# Patient Record
Sex: Female | Born: 1975 | Hispanic: No | Marital: Married | Smoking: Never smoker
Health system: Southern US, Community
[De-identification: ages and names within clinical notes are randomized; demographics above are authoritative.]

## PROBLEM LIST (undated history)

## (undated) ENCOUNTER — Emergency Department (HOSPITAL_COMMUNITY): Admission: EM | Payer: BC Managed Care – PPO | Source: Home / Self Care

---

## 2015-09-20 DIAGNOSIS — M7062 Trochanteric bursitis, left hip: Secondary | ICD-10-CM | POA: Diagnosis not present

## 2015-09-20 DIAGNOSIS — M25552 Pain in left hip: Secondary | ICD-10-CM | POA: Diagnosis not present

## 2015-10-12 DIAGNOSIS — M7062 Trochanteric bursitis, left hip: Secondary | ICD-10-CM | POA: Diagnosis not present

## 2015-10-12 DIAGNOSIS — M76892 Other specified enthesopathies of left lower limb, excluding foot: Secondary | ICD-10-CM | POA: Diagnosis not present

## 2015-10-25 DIAGNOSIS — M7062 Trochanteric bursitis, left hip: Secondary | ICD-10-CM | POA: Diagnosis not present

## 2015-10-25 DIAGNOSIS — M76892 Other specified enthesopathies of left lower limb, excluding foot: Secondary | ICD-10-CM | POA: Diagnosis not present

## 2015-11-21 DIAGNOSIS — M25552 Pain in left hip: Secondary | ICD-10-CM | POA: Diagnosis not present

## 2015-11-21 DIAGNOSIS — S73192A Other sprain of left hip, initial encounter: Secondary | ICD-10-CM | POA: Diagnosis not present

## 2015-11-21 DIAGNOSIS — Y33XXXA Other specified events, undetermined intent, initial encounter: Secondary | ICD-10-CM | POA: Diagnosis not present

## 2015-11-21 DIAGNOSIS — M769 Unspecified enthesopathy, lower limb, excluding foot: Secondary | ICD-10-CM | POA: Diagnosis not present

## 2015-11-21 DIAGNOSIS — M7062 Trochanteric bursitis, left hip: Secondary | ICD-10-CM | POA: Diagnosis not present

## 2015-11-21 DIAGNOSIS — S73102A Unspecified sprain of left hip, initial encounter: Secondary | ICD-10-CM | POA: Diagnosis not present

## 2015-11-21 DIAGNOSIS — M7630 Iliotibial band syndrome, unspecified leg: Secondary | ICD-10-CM | POA: Diagnosis not present

## 2015-12-06 DIAGNOSIS — M7632 Iliotibial band syndrome, left leg: Secondary | ICD-10-CM | POA: Diagnosis not present

## 2015-12-06 DIAGNOSIS — M7062 Trochanteric bursitis, left hip: Secondary | ICD-10-CM | POA: Diagnosis not present

## 2015-12-31 DIAGNOSIS — G8929 Other chronic pain: Secondary | ICD-10-CM | POA: Diagnosis not present

## 2015-12-31 DIAGNOSIS — M25552 Pain in left hip: Secondary | ICD-10-CM | POA: Diagnosis not present

## 2016-01-07 DIAGNOSIS — M25552 Pain in left hip: Secondary | ICD-10-CM | POA: Diagnosis not present

## 2016-01-07 DIAGNOSIS — G8929 Other chronic pain: Secondary | ICD-10-CM | POA: Diagnosis not present

## 2016-01-15 DIAGNOSIS — G8929 Other chronic pain: Secondary | ICD-10-CM | POA: Diagnosis not present

## 2016-01-15 DIAGNOSIS — M25552 Pain in left hip: Secondary | ICD-10-CM | POA: Diagnosis not present

## 2016-01-23 DIAGNOSIS — M25552 Pain in left hip: Secondary | ICD-10-CM | POA: Diagnosis not present

## 2016-01-23 DIAGNOSIS — G8929 Other chronic pain: Secondary | ICD-10-CM | POA: Diagnosis not present

## 2016-01-30 DIAGNOSIS — M25552 Pain in left hip: Secondary | ICD-10-CM | POA: Diagnosis not present

## 2016-01-30 DIAGNOSIS — G8929 Other chronic pain: Secondary | ICD-10-CM | POA: Diagnosis not present

## 2016-02-23 DIAGNOSIS — R6889 Other general symptoms and signs: Secondary | ICD-10-CM | POA: Diagnosis not present

## 2016-02-23 DIAGNOSIS — R69 Illness, unspecified: Secondary | ICD-10-CM | POA: Diagnosis not present

## 2016-05-06 DIAGNOSIS — S73192A Other sprain of left hip, initial encounter: Secondary | ICD-10-CM | POA: Diagnosis not present

## 2016-05-19 DIAGNOSIS — N921 Excessive and frequent menstruation with irregular cycle: Secondary | ICD-10-CM | POA: Diagnosis not present

## 2016-05-19 DIAGNOSIS — Z0000 Encounter for general adult medical examination without abnormal findings: Secondary | ICD-10-CM | POA: Diagnosis not present

## 2016-05-19 DIAGNOSIS — Z1231 Encounter for screening mammogram for malignant neoplasm of breast: Secondary | ICD-10-CM | POA: Diagnosis not present

## 2016-05-20 DIAGNOSIS — N921 Excessive and frequent menstruation with irregular cycle: Secondary | ICD-10-CM | POA: Diagnosis not present

## 2016-05-20 DIAGNOSIS — Z0000 Encounter for general adult medical examination without abnormal findings: Secondary | ICD-10-CM | POA: Diagnosis not present

## 2016-05-20 DIAGNOSIS — S73192A Other sprain of left hip, initial encounter: Secondary | ICD-10-CM | POA: Diagnosis not present

## 2016-05-20 DIAGNOSIS — M25552 Pain in left hip: Secondary | ICD-10-CM | POA: Diagnosis not present

## 2016-06-05 DIAGNOSIS — Z1231 Encounter for screening mammogram for malignant neoplasm of breast: Secondary | ICD-10-CM | POA: Diagnosis not present

## 2016-06-05 DIAGNOSIS — N920 Excessive and frequent menstruation with regular cycle: Secondary | ICD-10-CM | POA: Diagnosis not present

## 2016-06-12 ENCOUNTER — Ambulatory Visit (INDEPENDENT_AMBULATORY_CARE_PROVIDER_SITE_OTHER): Payer: BLUE CROSS/BLUE SHIELD | Admitting: Sports Medicine

## 2016-06-12 DIAGNOSIS — M25552 Pain in left hip: Secondary | ICD-10-CM | POA: Diagnosis not present

## 2016-06-12 NOTE — Assessment & Plan Note (Addendum)
This is likely biomechanical. Recommended line drills for her to do 10 times for 75 yards each time she runs Gave hip strengthening exercises to maintain hip strength bilaterally. Also gave green inserts with a heel wedge on the left foot to help with leg length  Follow-up in 6 weeks

## 2016-06-12 NOTE — Progress Notes (Signed)
  Marie Mooney - 41 y.o. female MRN [id number]  Date of birth: 09/03/1975  SUBJECTIVE:  Including CC & ROS.  CC: left hip pain  Presents with left hip pain that is been ongoing for one year. She has had multiple imaging studies and physical therapy and injections to try to help with her pain.  She has seen a lot of doctors and no one has been able to figure out her pain. She had an MRI which showed a superior labral tear. However, she had an intra-articular injection which did not help with her pain while running. The pain is difficult to localize and occurs at different spots at different times. Sometimes it is more anterior sometimes it is more lateral and sometimes it is more posterior. It only occurs while she is running. It is worse when she is running on the road especially on hills. It does her a little bit with a treadmill, not so much with the elliptical.  It began when she was running and training for a marathon last year.  She denies any back pain, numbness, tingling, radicular pain.   ROS: No unexpected weight loss, fever, chills, swelling, instability, muscle pain, numbness/tingling, redness, otherwise see HPI   PMHx - Updated and reviewed.  Contributory factors include: Negative PSHx - Updated and reviewed.  Contributory factors include:  Negative FHx - Updated and reviewed.  Contributory factors include:  Negative Social Hx - Updated and reviewed. Contributory factors include: Runner Medications - reviewed   DATA REVIEWED: AP pelvis which showed a paucity of arthritis MRI left hip which showed a superior labral tear  PHYSICAL EXAM:  VS: BP:100/70  HR: bpm  TEMP: ( )  RESP:   HT:5\' 5"  (165.1 cm)   WT:118 lb (53.5 kg)  BMI:19.7 PHYSICAL EXAM: Gen: NAD, alert, cooperative with exam, well-appearing HEENT: clear conjunctiva,  CV:  no edema, capillary refill brisk, normal rate Resp: non-labored Skin: no rashes, normal turgor  Neuro: no gross deficits.  Psych:  alert and  oriented  Hip: ROM IR: 45 Deg, ER: 45 Deg, Flexion: 80 Deg, Extension: 100 Deg, Abduction: 45 Deg, Adduction: 45 Deg Strength IR: 5/5, ER: 5/5, Flexion: 5/5, Extension: 5/5, Abduction: 5/5, Adduction: 5/5 Pelvic alignment left hip shorter than right hip upon standing Left ASIS to left superior medial malleolus 78 centimeters Right ASIS to left superior medial malleolus 80 centimeters Standing hip rotation and gait without trendelenburg sign / unsteadiness. Greater trochanter without tenderness to palpation. No tenderness over piriformis. No pain with FABER or FADIR. No SI joint tenderness and normal minimal SI movement.  Running gait shows that she externally rotates her left foot, left knee goes into general valgus and left foot does overpronation  She also pronates left more than right when walking   ASSESSMENT & PLAN:   Left hip pain This is likely biomechanical. Recommended line drills for her to do 10 times for 75 yards each time she runs Gave hip strengthening exercises to maintain hip strength bilaterally. Also gave green inserts with a heel wedge on the left foot to help with leg length  Follow-up in 6 weeks  Patient was counseled reviewing diagnosis and treatment in detail, totaling in 45 minutes, over half of which was spent in face to face counseling and evaluation of running form.

## 2016-06-18 DIAGNOSIS — R946 Abnormal results of thyroid function studies: Secondary | ICD-10-CM | POA: Diagnosis not present

## 2016-06-18 DIAGNOSIS — Z124 Encounter for screening for malignant neoplasm of cervix: Secondary | ICD-10-CM | POA: Diagnosis not present

## 2016-06-18 DIAGNOSIS — N921 Excessive and frequent menstruation with irregular cycle: Secondary | ICD-10-CM | POA: Diagnosis not present

## 2016-06-20 DIAGNOSIS — R946 Abnormal results of thyroid function studies: Secondary | ICD-10-CM | POA: Diagnosis not present

## 2016-06-23 DIAGNOSIS — N921 Excessive and frequent menstruation with irregular cycle: Secondary | ICD-10-CM | POA: Diagnosis not present

## 2016-06-23 DIAGNOSIS — Z30430 Encounter for insertion of intrauterine contraceptive device: Secondary | ICD-10-CM | POA: Diagnosis not present

## 2016-07-17 ENCOUNTER — Ambulatory Visit (INDEPENDENT_AMBULATORY_CARE_PROVIDER_SITE_OTHER): Payer: BLUE CROSS/BLUE SHIELD | Admitting: Sports Medicine

## 2016-07-17 ENCOUNTER — Encounter: Payer: Self-pay | Admitting: Sports Medicine

## 2016-07-17 DIAGNOSIS — M25552 Pain in left hip: Secondary | ICD-10-CM | POA: Diagnosis not present

## 2016-07-17 DIAGNOSIS — M2170 Unequal limb length (acquired), unspecified site: Secondary | ICD-10-CM | POA: Diagnosis not present

## 2016-07-17 NOTE — Assessment & Plan Note (Signed)
Excellent response to gait retraining Still doing some HEP  I think this was likely triggered by her biomechanics and if she continues to improve and run without pain can see us prn

## 2016-07-17 NOTE — Assessment & Plan Note (Signed)
Cont. Using lift for all running  Given catalogue for insoles and for lift

## 2016-07-17 NOTE — Progress Notes (Signed)
   Subjective:    Patient ID: Marie Mooney, female    DOB: 01/26/1975, 41 y.o.   MRN: [id number]  HPI Ms. Marie Mooney is a 41 yo female who presents for follow up of Left hip pain. She was last seen in May and her symptoms were thought to be biomechanical. She was given line drills, hip strengthening exercises, and a heel wedge on the Left foot as she had some leg discrepancy. She reports her pain has significantly improved in the past 1.5 weeks. She is able to run longer distances and for a longer period of time. Now she stops less frequently but only to stretch as sometimes her left lateral thigh gets tight. Running uphill still bothers her. She does wear the heel wedge given to her.    Review of Systems No lower extremity weakness, numbness, or swelling     Objective:   Physical Exam Gen: NAD BP 100/60   Ht 5\' 5"  (1.651 m)   Wt 118 lb (53.5 kg)   BMI 19.64 kg/m   HIP: No tenderness to palpation of the greater trochanter Normal range of motion of the hip  Normal hip alignment and leg alignment  Normal strength  No pain with Fadir test  Neurovascularly intact  Good running form (much improved)  On gait she has resolved the dynamic genu valgus/ she is now bringing feet closer to midline and getting more normal terminal IR of hip bilat     Assessment & Plan:  Left Hip Pain: likely due to biomechanics. Symptoms improved  - continue strengthening exercise and wedge lift on the left foot.  - follow up PRN   Palma HolterKanishka G Phinneas Shakoor, MD PGY 2 Family Medicine   I observed and examined the patient with the resident and agree with assessment and plan.  Note reviewed and modified by me. Enid BaasKarl Fields, MD

## 2016-08-23 DIAGNOSIS — Z681 Body mass index (BMI) 19 or less, adult: Secondary | ICD-10-CM | POA: Diagnosis not present

## 2016-08-23 DIAGNOSIS — R300 Dysuria: Secondary | ICD-10-CM | POA: Diagnosis not present

## 2016-08-23 DIAGNOSIS — N3001 Acute cystitis with hematuria: Secondary | ICD-10-CM | POA: Diagnosis not present

## 2016-08-24 DIAGNOSIS — R300 Dysuria: Secondary | ICD-10-CM | POA: Diagnosis not present

## 2017-06-19 DIAGNOSIS — D225 Melanocytic nevi of trunk: Secondary | ICD-10-CM | POA: Diagnosis not present

## 2017-06-19 DIAGNOSIS — L720 Epidermal cyst: Secondary | ICD-10-CM | POA: Diagnosis not present

## 2017-06-19 DIAGNOSIS — L821 Other seborrheic keratosis: Secondary | ICD-10-CM | POA: Diagnosis not present

## 2017-06-19 DIAGNOSIS — L74512 Primary focal hyperhidrosis, palms: Secondary | ICD-10-CM | POA: Diagnosis not present

## 2017-10-21 DIAGNOSIS — R300 Dysuria: Secondary | ICD-10-CM | POA: Diagnosis not present

## 2017-12-09 DIAGNOSIS — R790 Abnormal level of blood mineral: Secondary | ICD-10-CM | POA: Diagnosis not present

## 2017-12-09 DIAGNOSIS — Z681 Body mass index (BMI) 19 or less, adult: Secondary | ICD-10-CM | POA: Diagnosis not present

## 2017-12-09 DIAGNOSIS — R7989 Other specified abnormal findings of blood chemistry: Secondary | ICD-10-CM | POA: Diagnosis not present

## 2017-12-09 DIAGNOSIS — Z0000 Encounter for general adult medical examination without abnormal findings: Secondary | ICD-10-CM | POA: Diagnosis not present

## 2018-01-11 ENCOUNTER — Ambulatory Visit (INDEPENDENT_AMBULATORY_CARE_PROVIDER_SITE_OTHER): Payer: BLUE CROSS/BLUE SHIELD | Admitting: Sports Medicine

## 2018-01-11 ENCOUNTER — Ambulatory Visit: Payer: Self-pay

## 2018-01-11 ENCOUNTER — Encounter: Payer: Self-pay | Admitting: Sports Medicine

## 2018-01-11 VITALS — BP 120/80 | Ht 65.0 in | Wt 117.0 lb

## 2018-01-11 DIAGNOSIS — M25552 Pain in left hip: Secondary | ICD-10-CM | POA: Diagnosis not present

## 2018-01-11 DIAGNOSIS — S76112A Strain of left quadriceps muscle, fascia and tendon, initial encounter: Secondary | ICD-10-CM | POA: Insufficient documentation

## 2018-01-11 MED ORDER — NITROGLYCERIN 0.2 MG/HR TD PT24: MEDICATED_PATCH | TRANSDERMAL | 1 refills | Status: AC

## 2018-01-11 NOTE — Progress Notes (Signed)
Silvio ClaymanLauren Ackley - 42 y.o. female MRN [id number]  Date of birth: 12/13/1975   Chief complaint: L thigh discomfort, fatigue  SUBJECTIVE:    History of present illness: Leotis ShamesLauren is a 42yo female runner who presents today with the chief complaint of L anterior thigh fatigue and discomfort. Symptoms began after she [place] a [place] in [place] in [place] 2019.  She describes her discomfort as feeling like her left thigh is tight and that she needs to stretch her quadriceps muscle.  She is attempted to run after her marathon however she continues to have this discomfort which is limiting her from running longer runs.  She has done the elliptical and did not have any significant discomfort with this.  She also has been taking part in TRX workouts and has been doing deep squats which she feels may have aggravated her symptoms.  Prior to her marathon, she did notice a vague discomfort after 1 mile of running however she was able to stretch it out and continue running.  She denies any severe groin pain.  She denies any numbness or tingling of the extremity.  No low back pain.  No knee pain associated with her current problems.  Last year, she was dealing with hip abductor weakness as well as a dynamic genu valgus running form which she corrected over the past year.  She does run in [employer] ghost shoes.  No other concerns today.  She denies any trauma or mechanical falls.   Review of systems:  As stated above   Interval past medical history, surgical history, family history, and social history obtained and are unchanged.   Of note, no history of stress fractures or osteoporosis.  No family history of bone or joint diseases.  She is a non-smoker.  Medications reviewed and unchanged. Allergies reviewed and unchanged.  OBJECTIVE:  Physical exam: Vital signs are reviewed. BP 120/80   Ht 5\' 5"  (1.651 m)   Wt 117 lb (53.1 kg)   BMI 19.47 kg/m   Gen.: Alert, oriented, appears stated age, in no apparent  distress Integumentary: No rashes or ecchymoses Neurologic:  Sensation is intact to light touch L4-S1 on the left Gait: normal without associated limp, 10 point gait analysis was performed today which demonstrates a neutral running posture.  She is a Engineer, agriculturalmidfoot Striker.  She has trivial external rotation of her hip on the left side with her great toe slightly lateral with foot strike.  Otherwise, significantly improved running form. Psych: Normal affect, mood is described as good Back: There is no midline cervical, thoracic, lumbar spine tenderness.  No reactive paraspinal tenderness on the left in the lumbar region. Musculoskeletal: Inspection of her left lower extremity demonstrates a trace leg length abnormality with the left leg being just slightly shorter than the right.  She has no tenderness palpation over her greater trochanter.  She has full range of motion of her left hip which is pain-free.  Strength testing of her hamstring 5 out of 5, rectus femoris 5 out of 5, sartorius 5 out of 5, hip abductors 5 out of 5.  Negative Pearlean BrownieFaber and Fadir testing.  Negative logroll test.  Negative lever test.  She is able to hop on her left leg without pain.  She is neurovascularly intact.  ULTRASOUND: L anterior hip Quick, limited diagnostic ultrasound obtained of patient's L anterior hip.  -Examination of the sartorius and long axis and transverse axis demonstrate no evidence of calcific tendinopathy, tears or a avulsion injuries. -Examination of  the rectus femoris at the AIIS demonstrates a calcification at the origin of the rectus femoris.  There is also a hypoechoic portion in the proximal rectus femoris without evidence of neovascularization consistent with prior injury/scar.  This was demonstrated both in long axis and short axis. -No evidence of femoral neck fractures. -Hyperechoic calcification over the labrum just distal to the acetabulum.  Partial hypoechoic irregularity of the labrum concerning for  possible tear.  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  Ultrasound and interpretation by Gustavus MessingAJ Pinney, DO and Sibyl ParrKarl B. 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  There is no evidence of neovascularization to this area.  Images were uploaded to the PACS system. -Nitroglycerin protocol ordered for this.  I did draw an marker where she should place the medicine.  I counseled on headaches and the use of acetaminophen for the first couple days when starting nitroglycerin.  She was counseled on common and severe side effects. -Hip quadriceps strengthening exercises given, four exercises to be completed daily for the next 4 to 6 weeks.  She was given hip flexion, hip flexion with external rotation, lateral step ups. -Crosstraining with recumbent bike and/or elliptical.  Avoid deep squatting as this may worsen symptoms.   Orders Placed This Encounter  Procedures  . US LIMITED JOINT SPACE STRUCTURES LOW LEFT    Standing Status:   Future    Number of Occurrences:   1    Standing Expiration Date:   03/12/2019    Order Specific Question:   Reason for Exam (SYMPTOM  OR DIAGNOSIS REQUIRED)    Answer:   left hip pain    Order Specific Question:   Preferred imaging location?    Answer:   Internal    Meds ordered this encounter  Medications  . nitroGLYCERIN (NITRODUR - DOSED IN MG/24 HR) 0.2 mg/hr patch    Sig: Use 1/4 patch daily to the affected area    Dispense:  30 patch    Refill:  1      Gustavus MessingAJ Pinney, DO Sports Medicine Fellow [place]  I observed and examined the patient with the [facility] and agree with assessment and plan.  Note reviewed and modified by me. Enid BaasKarl Hayes Czaja, MD

## 2018-01-11 NOTE — Patient Instructions (Signed)
Today, we performed an ultrasound of your hip. We saw a calcification in your rectus femoris tendon. We also found a small tear in your labrum of your left hip. We will rehab your hip girdle muscles to get you back running.   Nitroglycerin Protocol   Apply 1/4 nitroglycerin patch to affected area daily.  Change position of patch within the affected area every 24 hours.  You may experience a headache during the first 1-2 weeks of using the patch, these should subside.  If you experience headaches after beginning nitroglycerin patch treatment, you may take your preferred over the counter pain reliever.  Another side effect of the nitroglycerin patch is skin irritation or rash related to patch adhesive.  Please notify our office if you develop more severe headaches or rash, and stop the patch.  Tendon healing with nitroglycerin patch may require 12 to 24 weeks depending on the extent of injury.  Men should not use if taking Viagra, Cialis, or Levitra.   Do not use if you have migraines or rosacea.   Exercises:  We gave you hip flexor strengthening exercises to be completed daily for the next 4 weeks. You may perform these and then cross train with biking exercises. Elliptical is fine if you are pain free. Avoid deep squats and adduction exercises.  When you start to feel better, you may try running at 15-20 min at a time. You may increase by 10% each week if pain free. We will see you back in 4-6 weeks.  Merry Christmas!  Dr. Laureen OchsPinney, Dr. Darrick PennaFields

## 2018-01-11 NOTE — Assessment & Plan Note (Signed)
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  There is no evidence of neovascularization to this area.  Images were uploaded to the PACS system. -Nitroglycerin protocol ordered for this.  I did draw an marker where she should place the medicine.  I counseled on headaches and the use of acetaminophen for the first couple days when starting nitroglycerin.  She was counseled on common and severe side effects. -Hip quadriceps strengthening exercises given, four exercises to be completed daily for the next 4 to 6 weeks.  She was given hip flexion, hip flexion with external rotation, lateral step ups. -Crosstraining with recumbent bike and/or elliptical.  Avoid deep squatting as this may worsen symptoms.

## 2018-02-16 ENCOUNTER — Ambulatory Visit (INDEPENDENT_AMBULATORY_CARE_PROVIDER_SITE_OTHER): Payer: BLUE CROSS/BLUE SHIELD | Admitting: Sports Medicine

## 2018-02-16 VITALS — BP 94/62 | Ht 65.0 in | Wt 118.0 lb

## 2018-02-16 DIAGNOSIS — S76112D Strain of left quadriceps muscle, fascia and tendon, subsequent encounter: Secondary | ICD-10-CM

## 2018-02-16 DIAGNOSIS — M25552 Pain in left hip: Secondary | ICD-10-CM | POA: Diagnosis not present

## 2018-02-16 NOTE — Progress Notes (Signed)
    Subjective:  Marie Mooney is a 43 y.o. female who presents to the [facility] today for follow-up for left hip pain.   HPI: Patient was seen in clinic approximately 1 month ago for left hip pain.  Point-of-care ultrasound showed calcific tendinosis of the rectus femoris at the origin of the AIIS and a partial tendon irregularity concerning for possible labrum tear.  Patient was given series of strengthening exercises and nitroglycerin patches.  She reports today that she has not had any improvement of her pain.  Is not worsening though.  She is tried to do the strengthening exercises daily, and has had no improvement of pain with the nitroglycerin patches.  Patient is also tried to be somewhat active at home doing elliptical or cycling with her family.  She has tried some light running but this is also been difficult.  The pain she says is located in her left inguinal region and can sometimes radiate down the left side of her leg.  She describes the radiation not so much as a pain but more of a "fatigue".  This is particularly worse when walking uphill or doing an incline, although that she can experience when just walking to work.  Patient not interested in taking any pain medication to mask pain, she is open to physical therapy has possible remedy.    Objective:  Physical Exam: BP 94/62   Ht 5\' 5"  (1.651 m)   Wt 118 lb (53.5 kg)   BMI 19.64 kg/m   Gen: NAD, seated comfortably  Left hip Inspection: No obvious gross swelling or irregularity Palpation: Mildly tender to palpation in the inguinal region ROM: 80 degrees hip flexion, full range of motion internal/external rotation Strength: 5/5 strength in rectus femoris, hip flexors and quads, 5/5 hamstrings, 5/5 hip adductors/abductors, 5/5 ankle plantar flexors. Stability: Joint is stable Special tests: Negative straight leg, [place] Vascular studies: Neurovascularly intact  Running gait is without a limp and good form   No results found  for this or any previous visit (from the past 72 hour(s)).   Assessment/Plan:  Strain of left quadriceps muscle Patient following up from strain of left quadriceps muscle.  No improvement with home exercise and Nitropatch.  Patient has good strength throughout.  Likely needs longer course of home physical therapy.  The addition of adjustable ankle weights to further increase strength. Follow-up in 4-8 weeks.   Lab Orders  No laboratory test(s) ordered today    No orders of the defined types were placed in this encounter.     Thomes Dinning, MD, MS FAMILY MEDICINE RESIDENT - PGY2 02/16/2018 11:27 AM   I observed and examined the patient with the resident and agree with assessment and plan.  Note reviewed and modified by me. Sterling Big, MD

## 2018-02-16 NOTE — Assessment & Plan Note (Addendum)
Patient following up from strain of left quadriceps muscle.  Minimal improvement with home exercise and Nitropatch.  Patient has good strength throughout.  Likely needs longer course of home physical therapy.  The addition of adjustable ankle weights to further increase strength. Follow-up in 4-8 weeks.  Increase NTG to 1/2 patch  OK to continue easy running on level

## 2018-02-16 NOTE — Assessment & Plan Note (Signed)
Joint exam is unremarkable and full ROM  Still appears to be hip flexor strain

## 2018-04-20 ENCOUNTER — Ambulatory Visit: Payer: BLUE CROSS/BLUE SHIELD | Admitting: Family Medicine

## 2018-04-20 ENCOUNTER — Encounter: Payer: Self-pay | Admitting: Sports Medicine

## 2018-04-20 ENCOUNTER — Other Ambulatory Visit: Payer: Self-pay

## 2018-04-20 ENCOUNTER — Ambulatory Visit (INDEPENDENT_AMBULATORY_CARE_PROVIDER_SITE_OTHER): Payer: BLUE CROSS/BLUE SHIELD | Admitting: Sports Medicine

## 2018-04-20 DIAGNOSIS — S76112D Strain of left quadriceps muscle, fascia and tendon, subsequent encounter: Secondary | ICD-10-CM | POA: Diagnosis not present

## 2018-04-20 NOTE — Progress Notes (Signed)
Patient consented to a video visit  Follow-up of left quadriceps calcific tendinopathy and hip pain  Patient first experienced hip pain in October 2019 running the [employer]	[place] marathon The pain started as tightness in her left upper thigh Following the marathon she could not get back to doing any long runs because of pain She was able to do an elliptical without pain  She was also involved in some strength workouts that included deep squats These deep squat would aggravate her pain as well  She had a prior history of hip abduction weakness  Ultrasound examination on 01/11/2018 demonstrated some calcification at the AIIS insertion of the rectus femoris as well as some calcification in her labrum  Based on her exam we felt the findings were most consistent with a rectus femoris tendinopathy and calcification  Since the initial visit she has started on a home exercise program to include some biking and hip exercises.  We also tried a nitroglycerin protocol.  No significant improvement noted on her follow-up visit January 28. At that point we increased her nitroglycerin to one half patch and also had her purchase ankle weights to increase the home exercise protocol  Since the time of her second visit she is able to walk up steps to do all her normal activities without pain She is unsure whether nitroglycerin was helping or not but she has cut back because she was getting a rash She has restarted running but can only go about 1/4 mile before she does some walking so she is alternating walking and running for up to 4 to 5 miles She is doing this on more of an every other day basis Since the gymnasium's are close down she has not been biking but has continued to use the elliptical at home without problems   Past history Evaluated in the past for a slight leg length inequality History of lateral hip pain which resolved with exercises   Review of systems No sciatica No groin pain No  pain and activities of daily living or at nighttime No numbness or tingling   Examination was deferred since this was a video visit However she was able to demonstrate that the area of discomfort still is in the upper thigh The tightness she gets is in a distribution of her quadriceps She can demonstrate good range of motion of the hip without pain

## 2018-04-20 NOTE — Assessment & Plan Note (Signed)
Unfortunately I think this has been a difficult injury to resolve I believe she must of had a pretty significant original tendon insertion injury for the rectus femoris  Currently her home exercises are emphasizing her abductors and rotators more than her hip flexors I wanted her to add back time on the bike at least every other day Add forward step ups and gradually build this to 2 steps using the ankle weights Keep up other hip exercises with the ankle weights  She is okay to continue easy running mixed with walking but should not push through significant pain or any limping  If this simply does not resolve we need to repeat the ultrasound and consider whether an injection into the calcified area might be helpful  Return or video visit in 4 weeks

## 2018-05-04 ENCOUNTER — Ambulatory Visit: Payer: BLUE CROSS/BLUE SHIELD | Admitting: Sports Medicine

## 2018-06-15 ENCOUNTER — Ambulatory Visit: Payer: Self-pay

## 2018-06-15 ENCOUNTER — Ambulatory Visit (INDEPENDENT_AMBULATORY_CARE_PROVIDER_SITE_OTHER): Payer: BLUE CROSS/BLUE SHIELD | Admitting: Sports Medicine

## 2018-06-15 ENCOUNTER — Encounter: Payer: Self-pay | Admitting: Sports Medicine

## 2018-06-15 ENCOUNTER — Other Ambulatory Visit: Payer: Self-pay

## 2018-06-15 VITALS — BP 90/64 | Ht 65.0 in | Wt 118.0 lb

## 2018-06-15 DIAGNOSIS — M25552 Pain in left hip: Secondary | ICD-10-CM

## 2018-06-15 NOTE — Assessment & Plan Note (Signed)
-  Initial thought was a strain of her left quadriceps muscle.  This appears to be resolved via ultrasound findings and physical exam. -Now currently of unclear etiology.  She does have a history of labral injury on that side in the past which may be also playing a role in delaying her recovery -Offered MRI of her left hip however we elected to not perform this given her known history of labral tear and history of 2 previous MRIs.  She also did have 2 failed hip injections with steroid in the past and we elected not to repeat this today. -Trial of 6 weeks of no impact activities.  No running. -Reviewed home exercise program and modified activities for home today.  She will take part in biking/elliptical -Nitroglycerin patch as prescribed as tolerated -Follow-up in 6 to 8 weeks.  If at that point she is not improving, we would strongly recommend obtaining an MRI versus doing a diagnostic hip injection -Body helix hip brace given today

## 2018-06-15 NOTE — Progress Notes (Addendum)
Marie Mooney - 43 y.o. 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  She was last seen back in March and has been managed conservatively for a left hip injury that occurred after the [employer].  She is frustrated as she has not felt any significant improvement for the past 5 months.  She continues to have left-sided hip and anterior thigh pain that is worse with activity.  She rates it 7 out of 10 with activity.  She is not having any difficulties with her activities of daily life.  No pain with walking.  She has tried running 3-4 times per week however she has to run walk one quarter of a mile at a time.  She does get to the point where she is limping.  She has tried crosstraining with biking which seems to be more tolerable.  She denies any new symptoms of numbness or tingling of her extremity.  Denies any low back pain.  No groin pain.  Treatment to date includes: Nitroglycerin patch, anti-inflammatory medications, home exercise program, and activity modification.  Of note which was not previously noted in prior encounters, she has a history of a labral tear of her left hip.   Review of systems:  Per HPI; in addition no fever, no rash, no additional weakness, no additional numbness, no additional paresthesias, and no additional fall/injury   Interval past medical history, surgical history, family history, and social history obtained.  Of note, she has a history of a labral tear of her left hip.  She is an avid marathon runner.  Medications reviewed and unchanged. Allergies reviewed and unchanged.  OBJECTIVE:  Physical exam: Vital signs are reviewed. BP 90/64   Ht 5\' 5"  (1.651 m)   Wt 118 lb (53.5 kg)   BMI 19.64 kg/m   Gen.: Alert, oriented, appears stated age, in no apparent distress Integumentary: No rashes  or ecchymoses of the left hip or upper leg Neurologic:  Sensation intact light touch L4-S1.  Negative straight leg raise on the left.  Negative Tinel's at the inguinal ligament Gait: normal without associated limp Musculoskeletal: Inspection of the left hip demonstrates no acute abnormalities.  No tenderness palpation over her greater trochanter.  No tenderness palpation at the ASIS or AIIS.  No limitations in internal rotation of the hip.  Full range of motion in all planes.  Strength testing of the hip is 5 out of 5 in resisted flexion, extension, abduction, and adduction.  Negative logroll test.  Negative Faber test.  Negative Fadir test.  Negative Stinchfield test.  Functional testing: Performed sit stand test in 60 seconds, 36 times 30 second wall sit performed without any reproduction of symptoms   ULTRASOUND: Left hip Limited diagnostic ultrasound obtained of patient's left hip.  -ASIS was visualized without any evidence of abnormalities or  irregularities in the sartorius. -AIIS was visualized with calcific change in the rectus femoris however no new tearing.  No evidence of neovascularization. -Hip joint was visualized with a hyperechoic calcification in the labrum distal to the acetabulum.  This is concerning for tear.  No evidence of cortical irregularities of the femoral neck.  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  Probable tear in the labrum.  Ultrasound and interpretation by Dr Laureen Ochs and Sibyl Parr. Darrick Penna, MD  ASSESSMENT & PLAN: Left hip pain -Initial thought was a strain of her left quadriceps muscle.  This appears to be resolved via ultrasound findings and physical exam. -Now currently of unclear etiology.  She does have a history of labral injury on that side in the past which may be also playing a role in delaying her recovery -Offered MRI of her left hip however we elected to not perform this given her known history of  labral tear and history of 2 previous MRIs.  She also did have 2 failed hip injections with steroid in the past and we elected not to repeat this today. -Trial of 6 weeks of no impact activities.  No running. -Reviewed home exercise program and modified activities for home today.  She will take part in biking/elliptical -Nitroglycerin patch as prescribed as tolerated -Follow-up in 6 to 8 weeks.  If at that point she is not improving, we would strongly recommend obtaining an MRI versus doing a diagnostic hip injection -Body helix hip brace given today   Orders Placed This Encounter  Procedures  . US LIMITED JOINT SPACE STRUCTURES LOW LEFT    Standing Status:   Future    Number of Occurrences:   1    Standing Expiration Date:   08/14/2019    Order Specific Question:   Reason for Exam (SYMPTOM  OR DIAGNOSIS REQUIRED)    Answer:   hip pain    Order Specific Question:   Preferred imaging location?    Answer:   Internal     Gustavus MessingAJ Pinney, DO Sports Medicine Fellow [facility]  I observed and examined the patient with Dr. Laureen OchsPinney and agree with assessment and plan.  Note reviewed and modified by me. Sterling BigKB Fields, MD

## 2018-07-29 NOTE — Addendum Note (Signed)
Addended by: Cyd Silence on: 07/29/2018 01:48 PM   Modules accepted: Orders

## 2018-08-15 ENCOUNTER — Ambulatory Visit
Admission: RE | Admit: 2018-08-15 | Discharge: 2018-08-15 | Disposition: A | Discharge status: Home / Self Care | Payer: BLUE CROSS/BLUE SHIELD | Source: Ambulatory Visit | Attending: Sports Medicine | Admitting: Sports Medicine

## 2018-08-15 ENCOUNTER — Other Ambulatory Visit: Payer: Self-pay

## 2018-08-15 DIAGNOSIS — S76112A Strain of left quadriceps muscle, fascia and tendon, initial encounter: Secondary | ICD-10-CM | POA: Diagnosis not present

## 2018-08-15 DIAGNOSIS — M25552 Pain in left hip: Secondary | ICD-10-CM

## 2018-08-25 ENCOUNTER — Other Ambulatory Visit: Payer: BLUE CROSS/BLUE SHIELD

## 2018-09-21 ENCOUNTER — Encounter: Payer: Self-pay | Admitting: *Deleted

## 2018-09-21 NOTE — Progress Notes (Signed)
Dr Deatra Robinson [facility] [address] [place], [place] [address] Phone: [phone] Tues 10/12/18 at 1p

## 2018-10-01 DIAGNOSIS — M25552 Pain in left hip: Secondary | ICD-10-CM | POA: Diagnosis not present

## 2018-10-14 DIAGNOSIS — M25552 Pain in left hip: Secondary | ICD-10-CM | POA: Diagnosis not present

## 2018-10-29 DIAGNOSIS — M25552 Pain in left hip: Secondary | ICD-10-CM | POA: Diagnosis not present

## 2018-11-03 DIAGNOSIS — M25552 Pain in left hip: Secondary | ICD-10-CM | POA: Diagnosis not present

## 2018-11-11 DIAGNOSIS — M25552 Pain in left hip: Secondary | ICD-10-CM | POA: Diagnosis not present

## 2018-11-25 DIAGNOSIS — M25552 Pain in left hip: Secondary | ICD-10-CM | POA: Diagnosis not present

## 2018-12-03 DIAGNOSIS — M25552 Pain in left hip: Secondary | ICD-10-CM | POA: Diagnosis not present

## 2018-12-15 DIAGNOSIS — Z0000 Encounter for general adult medical examination without abnormal findings: Secondary | ICD-10-CM | POA: Diagnosis not present

## 2018-12-15 DIAGNOSIS — R7989 Other specified abnormal findings of blood chemistry: Secondary | ICD-10-CM | POA: Diagnosis not present

## 2018-12-15 DIAGNOSIS — R55 Syncope and collapse: Secondary | ICD-10-CM | POA: Diagnosis not present

## 2018-12-15 DIAGNOSIS — Z23 Encounter for immunization: Secondary | ICD-10-CM | POA: Diagnosis not present

## 2018-12-15 DIAGNOSIS — I7300 Raynaud's syndrome without gangrene: Secondary | ICD-10-CM | POA: Diagnosis not present

## 2018-12-24 DIAGNOSIS — Z0000 Encounter for general adult medical examination without abnormal findings: Secondary | ICD-10-CM | POA: Diagnosis not present

## 2018-12-24 DIAGNOSIS — R7989 Other specified abnormal findings of blood chemistry: Secondary | ICD-10-CM | POA: Diagnosis not present

## 2018-12-30 DIAGNOSIS — M25552 Pain in left hip: Secondary | ICD-10-CM | POA: Diagnosis not present

## 2019-01-31 DIAGNOSIS — I7300 Raynaud's syndrome without gangrene: Secondary | ICD-10-CM | POA: Diagnosis not present

## 2019-01-31 DIAGNOSIS — Z7184 Encounter for health counseling related to travel: Secondary | ICD-10-CM | POA: Diagnosis not present

## 2019-03-23 DIAGNOSIS — Z23 Encounter for immunization: Secondary | ICD-10-CM | POA: Diagnosis not present

## 2019-04-20 DIAGNOSIS — Z23 Encounter for immunization: Secondary | ICD-10-CM | POA: Diagnosis not present

## 2019-05-26 DIAGNOSIS — Z23 Encounter for immunization: Secondary | ICD-10-CM | POA: Diagnosis not present

## 2019-05-26 DIAGNOSIS — D72819 Decreased white blood cell count, unspecified: Secondary | ICD-10-CM | POA: Diagnosis not present

## 2019-05-26 DIAGNOSIS — Z283 Underimmunization status: Secondary | ICD-10-CM | POA: Diagnosis not present

## 2019-07-14 DIAGNOSIS — Z23 Encounter for immunization: Secondary | ICD-10-CM | POA: Diagnosis not present

## 2019-08-18 DIAGNOSIS — Z23 Encounter for immunization: Secondary | ICD-10-CM | POA: Diagnosis not present

## 2021-02-25 IMAGING — MR MRI OF THE LEFT HIP WITHOUT CONTRAST
5 series · 34 of 40 positions shown · non-contrast
Comparison: None.

CLINICAL DATA: Chronic left hip and leg pain when running, hiking
and walking up stairs x 9. Pt states issues are likely from a no
known injury. Previous labral injury 3 and 4 years ago.

EXAM:
MR OF THE LEFT HIP WITHOUT CONTRAST
TECHNIQUE: Multiplanar, multisequence MR imaging was performed. No intravenous
contrast was administered.

[Series 9: T2 fat-sat · coronal · left · 3.0mm · 0.80mm/px · 8 of 30 slices shown (1 of 2)]
[im 1/30]
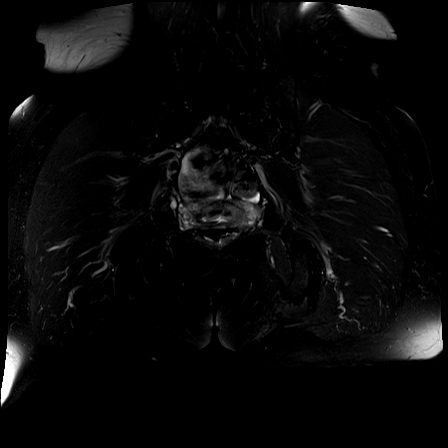
[im 5/30]
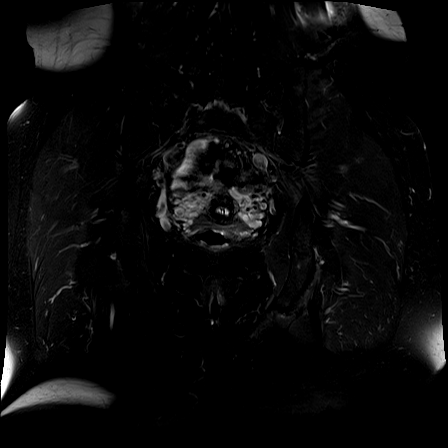
[im 9/30]
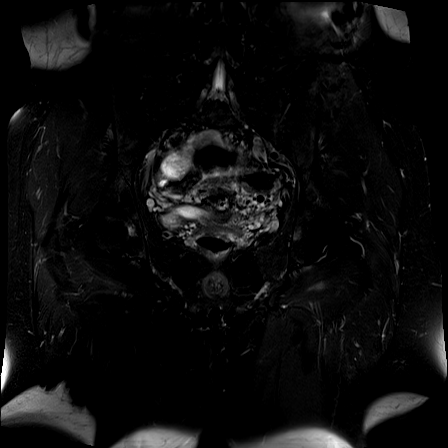
[im 13/30]
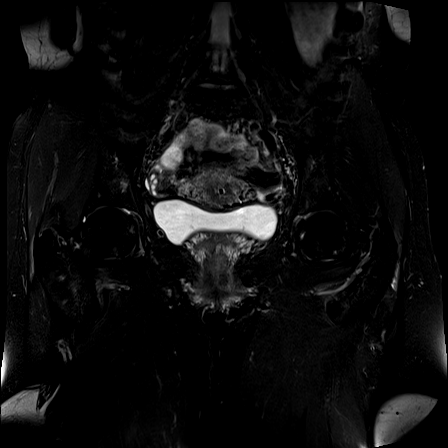
[im 17/30]
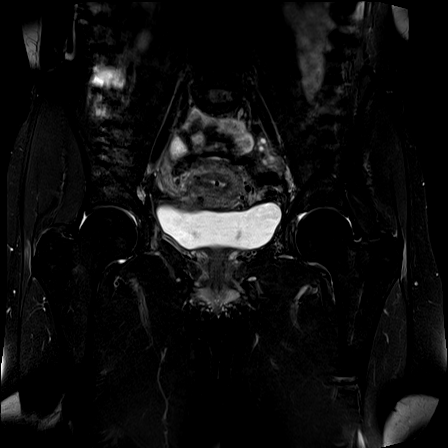
[im 21/30]
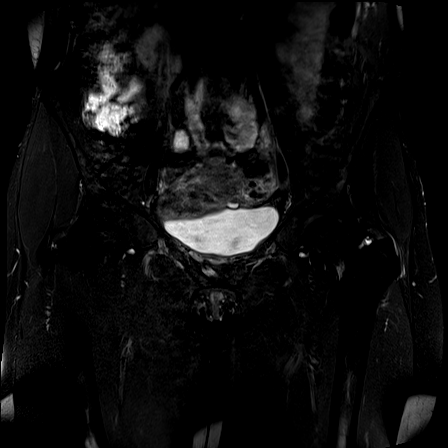
[im 25/30]
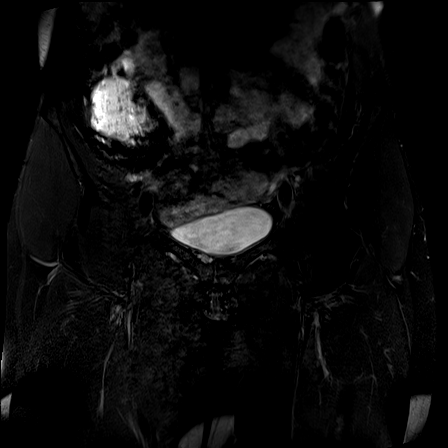
[im 30/30]
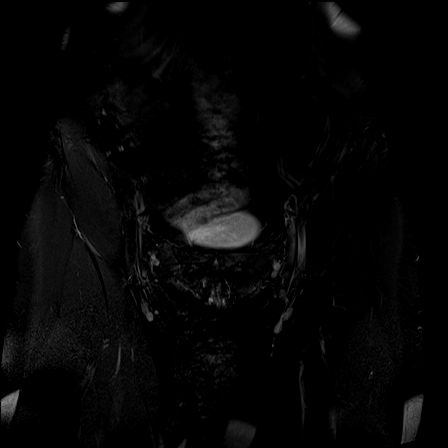

[Series 10: T1 · coronal · left · 3.0mm · 0.80mm/px · 2 of 30 slices shown]
[im 1/30]
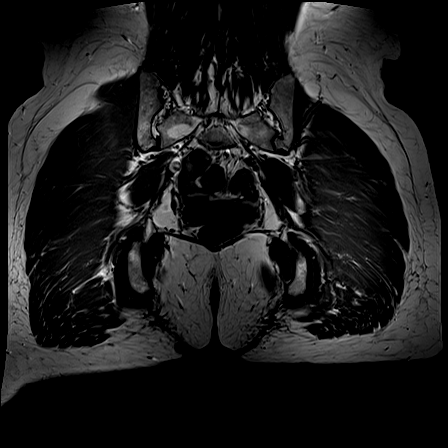
[im 5/30]
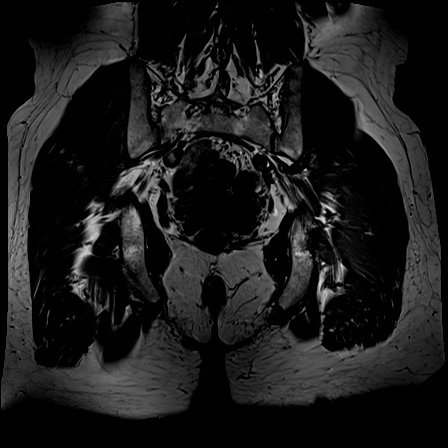

[Series 11: T2 fat-sat · axial · left · 3.0mm · 0.50mm/px · z∈[-171,-60]mm · 8 of 32 slices shown (2 of 2)]
[im 1/32]
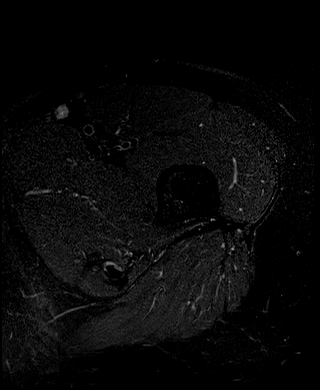
[im 5/32]
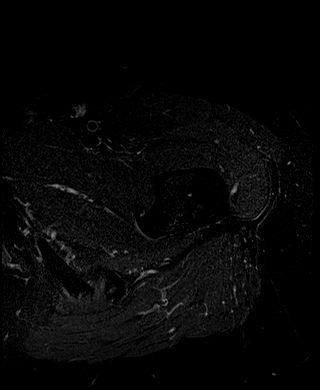
[im 9/32]
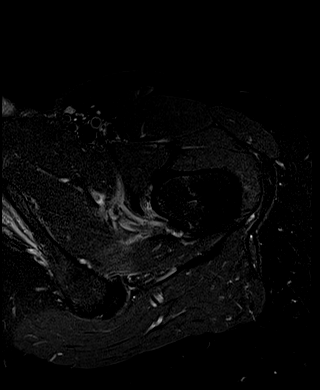
[im 14/32]
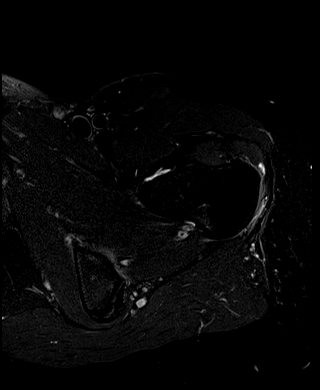
[im 18/32]
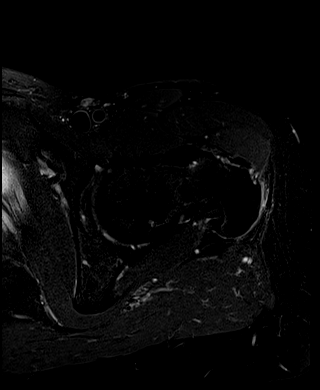
[im 23/32]
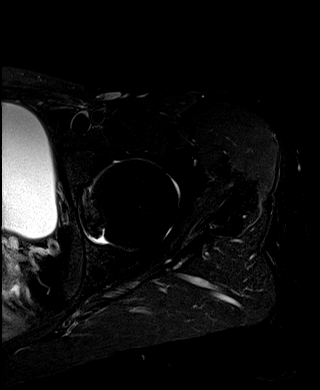
[im 27/32]
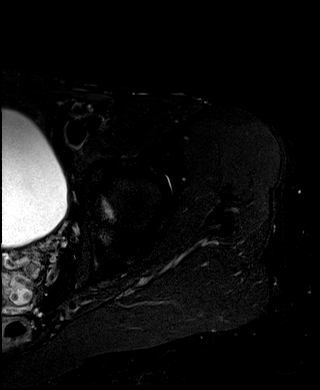
[im 32/32]
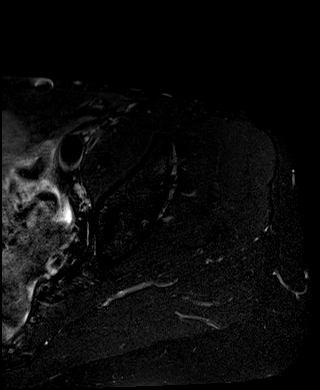

[Series 12: PD fat-sat · coronal · left · 3.0mm · 0.56mm/px · 7 of 28 slices shown (1 of 2)]
[im 1/28]
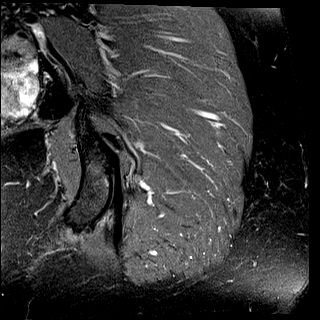
[im 5/28]
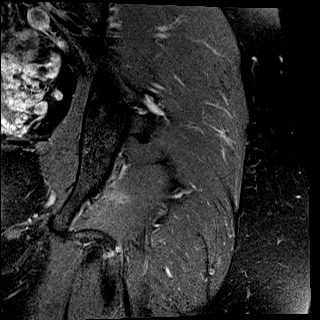
[im 10/28]
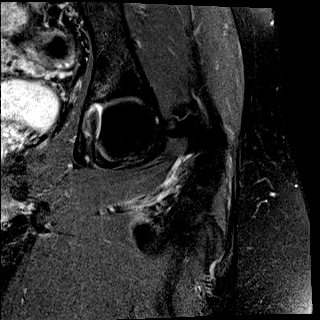
[im 14/28]
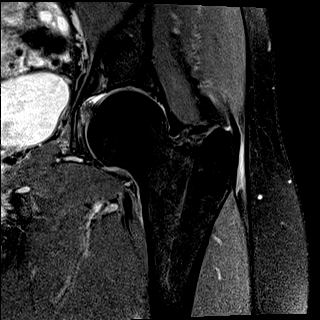
[im 19/28]
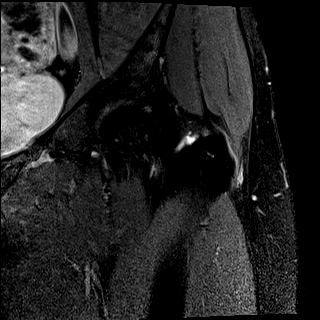
[im 23/28]
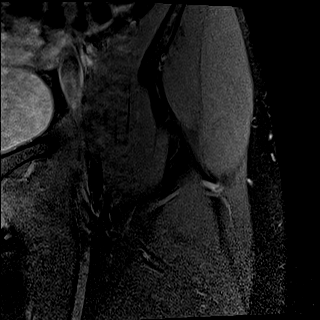
[im 28/28]
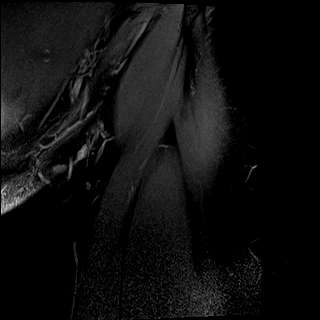

[Series 13: PD fat-sat · sagittal · left · 3.0mm · 0.56mm/px · 9 of 35 slices shown (2 of 2)]
[im 1/35]
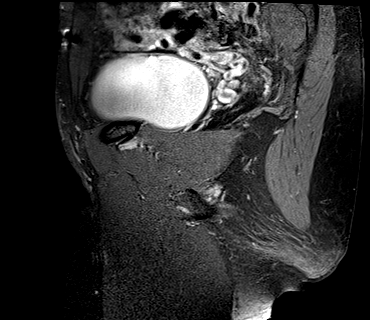
[im 5/35]
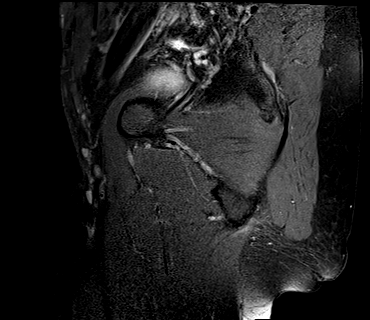
[im 9/35]
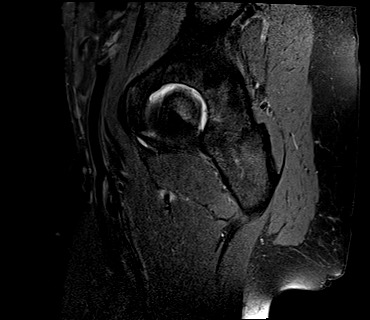
[im 13/35]
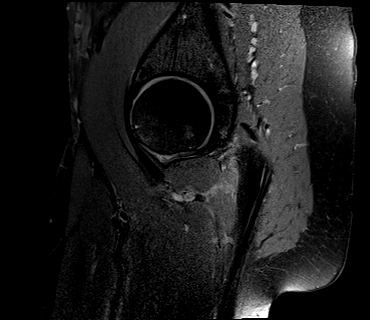
[im 18/35]
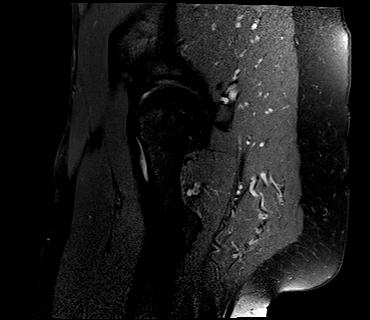
[im 22/35]
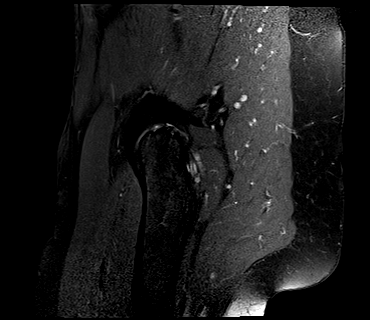
[im 26/35]
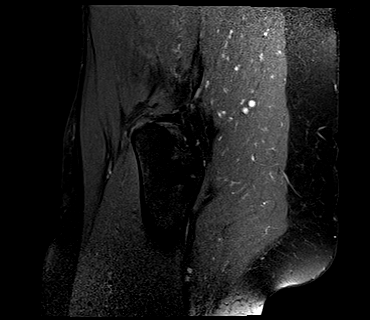
[im 30/35]
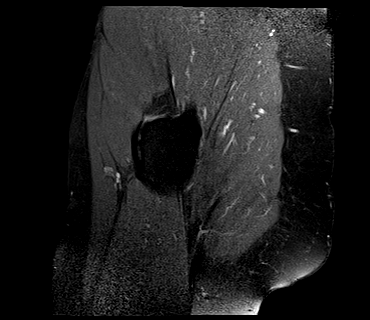
[im 35/35]
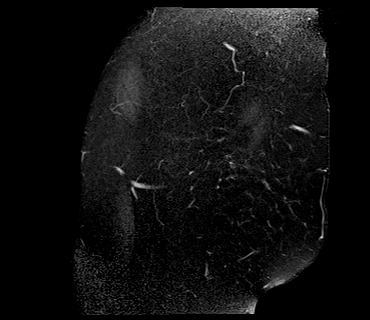

[34 of 40 positions shown; findings below may reference images not displayed]

FINDINGS: Bones: No hip fracture, dislocation or avascular necrosis. No
periosteal reaction or bone destruction. No aggressive osseous
lesion.

Normal sacrum and sacroiliac joints. No SI joint widening or erosive
changes.

Articular cartilage and labrum

Articular cartilage:  No chondral defect.

Labrum: Limited evaluation secondary lack of intra-articular fluid.
Attenuation of left superior labrum likely reflecting a small
chronic tear.

Joint or bursal effusion

Joint effusion:  No hip joint effusion.  No SI joint effusion.

Bursae:  Small amount of fluid in the left greater trochanter bursa.

Muscles and tendons

Flexors: Normal.

Extensors: Normal.

Abductors: Normal.

Adductors: Mild muscle edema in the left quadratus femoris muscle
most consistent with mild muscle strain without a tear. Remainder of
the adductor musculature is intact.

Gluteals: Normal.

Hamstrings: Minimal tendinosis of the left hamstring origin. Mild
subcortical reactive marrow changes at the origin of the left
hamstring.

Other findings

Miscellaneous: No pelvic free fluid. No fluid collection or
hematoma. No inguinal lymphadenopathy. No inguinal hernia.
IMPRESSION: 1. Mild muscle strain of the left quadratus femoris muscle without a
tear.
2. Minimal tendinosis of the left hamstring origin. Mild subcortical
reactive marrow changes at the origin of the left hamstring.
